# Patient Record
Sex: Male | Born: 1970 | Race: White | Hispanic: No | Marital: Single
Health system: Southern US, Community
[De-identification: ages and names within clinical notes are randomized; demographics above are authoritative.]

---

## 2005-12-24 ENCOUNTER — Emergency Department: Payer: Self-pay | Admitting: General Practice

## 2006-01-26 ENCOUNTER — Emergency Department: Payer: Self-pay | Admitting: Emergency Medicine

## 2007-03-24 ENCOUNTER — Emergency Department: Payer: Self-pay | Admitting: Emergency Medicine

## 2010-06-03 ENCOUNTER — Emergency Department: Payer: Self-pay | Admitting: Emergency Medicine

## 2010-12-10 ENCOUNTER — Emergency Department: Payer: Self-pay | Admitting: *Deleted

## 2011-06-30 ENCOUNTER — Emergency Department: Payer: Self-pay | Admitting: Emergency Medicine

## 2013-04-25 ENCOUNTER — Emergency Department: Payer: Self-pay | Admitting: Emergency Medicine

## 2016-01-06 IMAGING — CR LEFT INDEX FINGER 2+V
1 series · 3 of 3 positions shown · non-contrast
Comparison: None.

CLINICAL DATA: Index finger pain

EXAM:
LEFT INDEX FINGER 2+V

[Series 1: x finger pa left · 0.14mm/px · 3 of 3 slices shown]
[im 1/3]
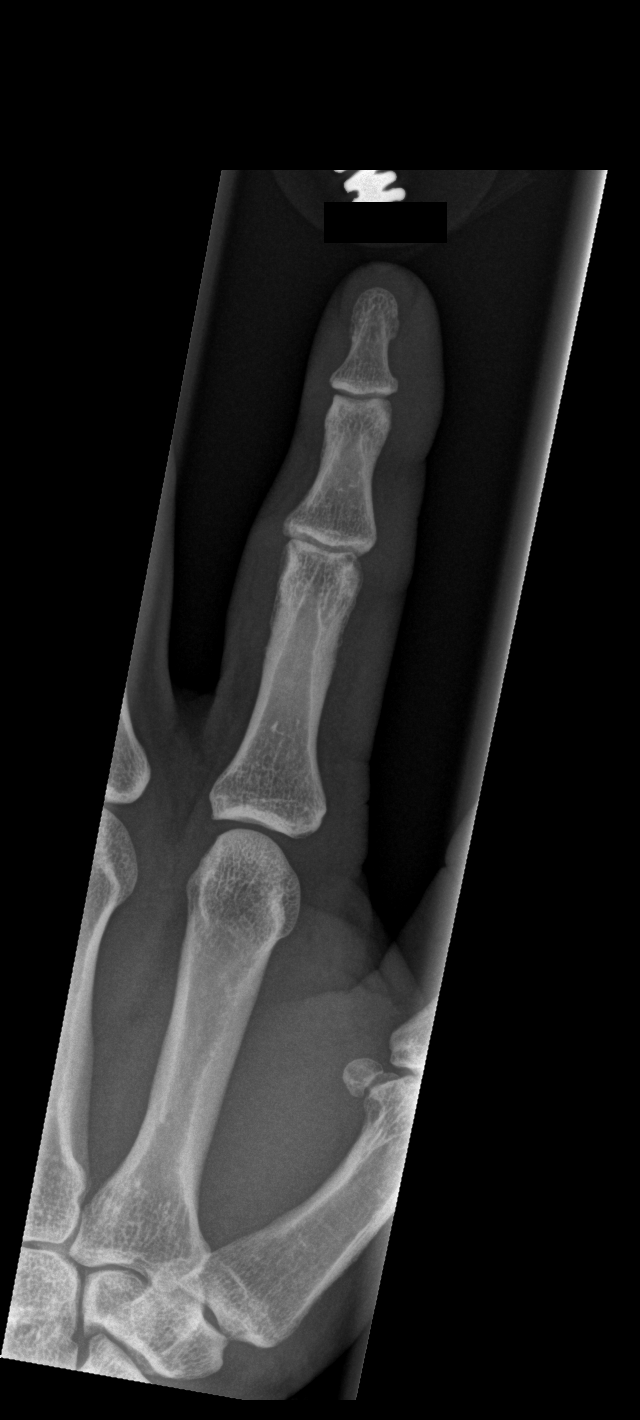
[im 2/3]
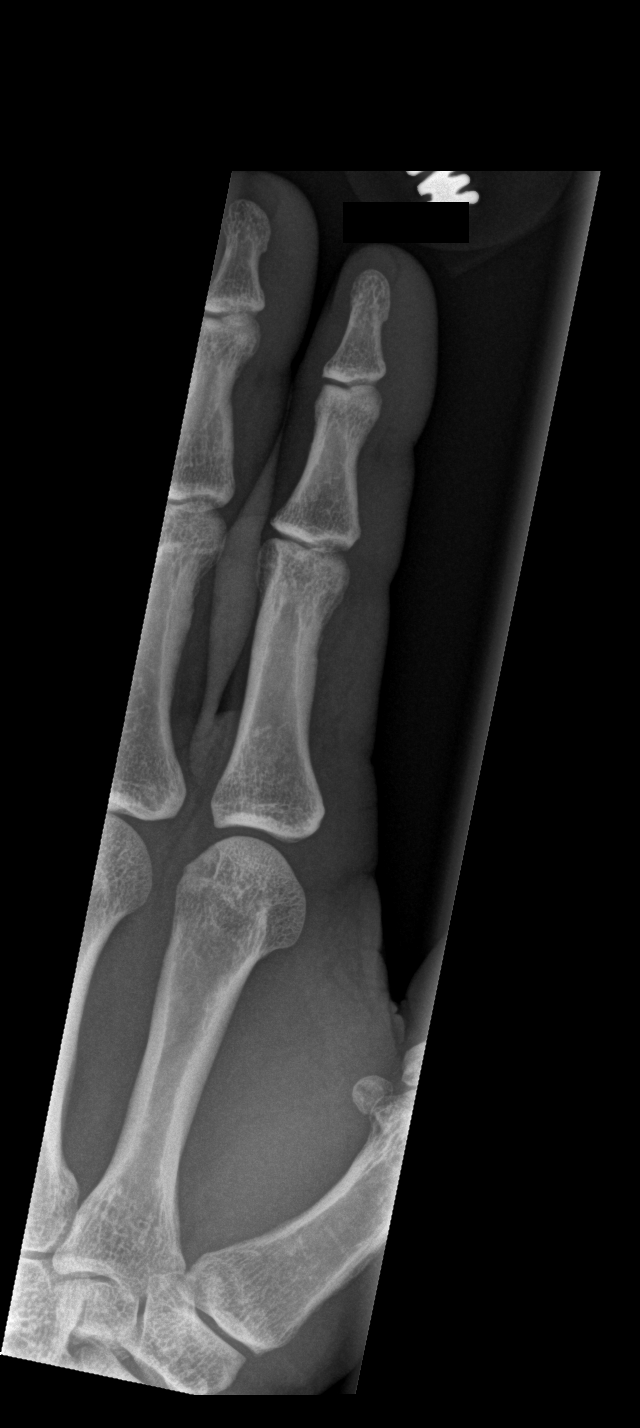
[im 3/3]
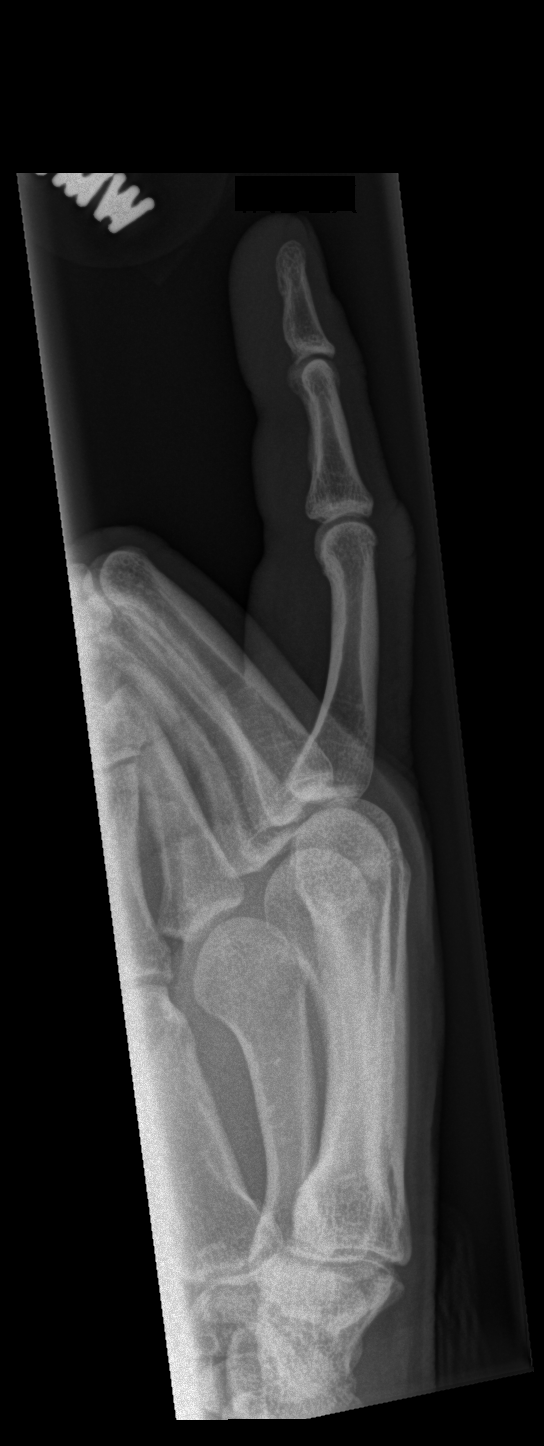

[3 of 3 positions shown; findings below may reference images not displayed]

FINDINGS: There is no evidence of fracture or dislocation. There is no
evidence of arthropathy or other focal bone abnormality. Soft
tissues are unremarkable.
IMPRESSION: Negative.

## 2019-07-14 ENCOUNTER — Ambulatory Visit: Payer: Self-pay | Attending: Internal Medicine

## 2019-07-14 DIAGNOSIS — Z23 Encounter for immunization: Secondary | ICD-10-CM

## 2019-07-14 NOTE — Progress Notes (Signed)
   [id number]  Name:  Dwayne Patrick.    MRN: [id number] DOB: 05/22/1970  07/14/2019  Mr. Dwayne Patrick was observed post Covid-19 immunization for 15 minutes without incident. He was provided with Vaccine Information Sheet and instruction to access the V-Safe system.   Mr. Dwayne Patrick was instructed to call 911 with any severe reactions post vaccine: Marland Kitchen Difficulty breathing  . Swelling of face and throat  . A fast heartbeat  . A bad rash all over body  . Dizziness and weakness   Immunizations Administered    Name Date Dose VIS Date Route   Pfizer COVID-19 Vaccine 07/14/2019 12:17 PM 0.3 mL 03/28/2019 Intramuscular   Manufacturer: [employer], [employer]   Lot: [id number]   NDC: [address]-1

## 2019-08-05 ENCOUNTER — Ambulatory Visit: Payer: Self-pay | Attending: Internal Medicine

## 2019-08-05 DIAGNOSIS — Z23 Encounter for immunization: Secondary | ICD-10-CM

## 2019-08-05 NOTE — Progress Notes (Signed)
   [id number]  Name:  Dwayne Patrick.    MRN: [id number] DOB: [birth year]  08/05/2019  [name] was observed post Covid-19 immunization for 15 minutes without incident. He was provided with Vaccine Information Sheet and instruction to access the V-Safe system.   [name] was instructed to call 911 with any severe reactions post vaccine: Marland Kitchen Difficulty breathing  . Swelling of face and throat  . A fast heartbeat  . A bad rash all over body  . Dizziness and weakness   Immunizations Administered    Name Date Dose VIS Date Route   Pfizer COVID-19 Vaccine 08/05/2019 12:01 PM 0.3 mL 06/11/2018 Intramuscular   Manufacturer: [employer], [employer]   Lot: [id number]   NDC: [address]-0

## 2024-01-23 ENCOUNTER — Ambulatory Visit: Payer: Self-pay

## 2024-01-23 NOTE — Telephone Encounter (Signed)
 FYI Only or Action Required?: FYI only for provider.  Called Nurse Triage reporting Shoulder Pain.  Symptoms began several months ago.  Symptoms are: gradually worsening.  Triage Disposition: See PCP Within 2 Weeks  Patient/caregiver understands and will follow disposition?: Yes     Copied from CRM [id number]. Topic: Clinical - Red Word Triage >> Jan 23, 2024 10:51 AM Dwayne Patrick wrote: Patient believes he has torn his rotator cuff on his left arm. He is unable to lift his arm all the way up. Patient stated his arm is killing him, difficulty sleeping at night. Patient is losing a lot of weight. He was 200lbs and is now 150-160lbs  Needs new patient appt, no PCP.       Reason for Disposition  Shoulder pain is a chronic symptom (recurrent or ongoing AND present > 4 weeks)  Answer Assessment - Initial Assessment Questions 1. ONSET: When did the pain start?     6-7 months  2. LOCATION: Where is the pain located?     Left shoulder  3. PAIN: How bad is the pain? (Scale 1-10; or mild, moderate, severe)     No pain except for laying down flat or when lifting the left arm up past a certain point  4. WORK OR EXERCISE: Has there been any recent work or exercise that involved this part of the body?     No 5. CAUSE: What do you think is causing the shoulder pain?     Believes he tore something 6-7 months ago  6. OTHER SYMPTOMS: Do you have any other symptoms? (e.g., neck pain, swelling, rash, fever, numbness, weakness)     No  Protocols used: Shoulder Pain-A-AH

## 2024-01-24 ENCOUNTER — Ambulatory Visit: Admitting: Physician Assistant
